# Patient Record
Sex: Male | Born: 1990 | Race: Black or African American | Hispanic: No | Marital: Married | Smoking: Never smoker
Health system: Southern US, Community
[De-identification: ages and names within clinical notes are randomized; demographics above are authoritative.]

---

## 2014-07-25 ENCOUNTER — Emergency Department (HOSPITAL_BASED_OUTPATIENT_CLINIC_OR_DEPARTMENT_OTHER)
Admission: EM | Admit: 2014-07-25 | Discharge: 2014-07-25 | Disposition: A | Discharge status: Home / Self Care | Payer: Commercial Managed Care - PPO | Attending: Emergency Medicine | Admitting: Emergency Medicine

## 2014-07-25 ENCOUNTER — Encounter (HOSPITAL_BASED_OUTPATIENT_CLINIC_OR_DEPARTMENT_OTHER): Payer: Self-pay | Admitting: Emergency Medicine

## 2014-07-25 ENCOUNTER — Emergency Department (HOSPITAL_BASED_OUTPATIENT_CLINIC_OR_DEPARTMENT_OTHER): Payer: Commercial Managed Care - PPO

## 2014-07-25 DIAGNOSIS — R0602 Shortness of breath: Secondary | ICD-10-CM | POA: Diagnosis not present

## 2014-07-25 DIAGNOSIS — R079 Chest pain, unspecified: Secondary | ICD-10-CM | POA: Diagnosis present

## 2014-07-25 DIAGNOSIS — I309 Acute pericarditis, unspecified: Secondary | ICD-10-CM | POA: Insufficient documentation

## 2014-07-25 DIAGNOSIS — E876 Hypokalemia: Secondary | ICD-10-CM | POA: Insufficient documentation

## 2014-07-25 LAB — TROPONIN I: Troponin I: <0.03 ng/mL (ref <0.031)

## 2014-07-25 LAB — BASIC METABOLIC PANEL
Anion gap: 10 (ref 5–15)
BUN: 18 mg/dL (ref 6–23)
CO2: 22 mmol/L (ref 19–32)
Calcium: 8.4 mg/dL (ref 8.4–10.5)
Chloride: 102 mmol/L (ref 96–112)
Creatinine, Ser: 0.98 mg/dL (ref 0.50–1.35)
GFR calc non Af Amer: >90 mL/min (ref >90)
GLUCOSE: 114 mg/dL — AB (ref 70–99)
POTASSIUM: 2.8 mmol/L — AB (ref 3.5–5.1)
Sodium: 134 mmol/L — ABNORMAL LOW (ref 135–145)

## 2014-07-25 LAB — CBC
HCT: 37.4 % — ABNORMAL LOW (ref 39.0–52.0)
Hemoglobin: 12.6 g/dL — ABNORMAL LOW (ref 13.0–17.0)
MCH: 29.4 pg (ref 26.0–34.0)
MCHC: 33.7 g/dL (ref 30.0–36.0)
MCV: 87.4 fL (ref 78.0–100.0)
PLATELETS: 237 10*3/uL (ref 150–400)
RBC: 4.28 MIL/uL (ref 4.22–5.81)
RDW: 13.4 % (ref 11.5–15.5)
WBC: 8.4 10*3/uL (ref 4.0–10.5)

## 2014-07-25 LAB — MAGNESIUM: MAGNESIUM: 1.9 mg/dL (ref 1.5–2.5)

## 2014-07-25 LAB — BRAIN NATRIURETIC PEPTIDE: B Natriuretic Peptide: 16.1 pg/mL (ref 0.0–100.0)

## 2014-07-25 MED ORDER — IBUPROFEN 600 MG PO TABS: 600.0000 mg | ORAL_TABLET | Freq: Four times a day (QID) | ORAL | Status: AC | PRN

## 2014-07-25 MED ORDER — POTASSIUM CHLORIDE CRYS ER 20 MEQ PO TBCR
40.0000 meq | EXTENDED_RELEASE_TABLET | Freq: Once | ORAL | Status: AC
  Administered 2014-07-25: 40 meq via ORAL
  Filled 2014-07-25: qty 2

## 2014-07-25 MED ORDER — ASPIRIN 81 MG PO CHEW
324.0000 mg | CHEWABLE_TABLET | Freq: Once | ORAL | Status: AC
  Administered 2014-07-25: 324 mg via ORAL
  Filled 2014-07-25: qty 4

## 2014-07-25 MED ORDER — PREDNISONE 50 MG PO TABS
60.0000 mg | ORAL_TABLET | Freq: Once | ORAL | Status: AC
  Administered 2014-07-25: 60 mg via ORAL
  Filled 2014-07-25 (×2): qty 1

## 2014-07-25 MED ORDER — PREDNISONE 50 MG PO TABS: 50.0000 mg | ORAL_TABLET | Freq: Every day | ORAL | Status: AC

## 2014-07-25 MED ORDER — MORPHINE SULFATE 4 MG/ML IJ SOLN
4.0000 mg | Freq: Once | INTRAMUSCULAR | Status: AC
  Administered 2014-07-25: 4 mg via INTRAVENOUS
  Filled 2014-07-25: qty 1

## 2014-07-25 NOTE — Discharge Instructions (Signed)
We think you have pericarditis (read below). The treatment is antiinflammatory meds. See the Cardiologist as requested. Return to the ER if the symptoms get worse, you pass out, there is increased shortness of breath or pain, or fevers.  ALSO, READ THE INFORMATION ON PULMONARY EMBOLISM. As discussed, we dont think you have a PE - but we cannot rule it out, so keep it's warning signs in the back of your mind as well.  Pericarditis Pericarditis is swelling (inflammation) of the pericardium. The pericardium is a thin, double-layered, fluid-filled tissue sac that surrounds the heart. The purpose of the pericardium is to contain the heart in the chest cavity and keep the heart from overexpanding. Different types of pericarditis can occur, such as:  Acute pericarditis. Inflammation can develop suddenly in acute pericarditis.  Chronic pericarditis. Inflammation develops gradually and is long-lasting in chronic pericarditis.  Constrictive pericarditis. In this type of pericarditis, the layers of the pericardium stiffen and develop scar tissue. The scar tissue thickens and sticks together. This makes it difficult for the heart to pump and work as it normally does. CAUSES  Pericarditis can be caused from different conditions, such as:  A bacterial, fungal or viral infection.  After a heart attack (myocardial infarction).  After open-heart surgery (coronary bypass graft surgery).  Auto-immune conditions such as lupus, rheumatoid arthritis or scleroderma.  Kidney failure.  Low thyroid condition (hypothyroidism).  Cancer from another part of the body that has spread (metastasized) to the pericardium.  Chest injury or trauma.  After radiation treatment.  Certain medicines. SYMPTOMS  Symptoms of pericarditis can include:  Chest pain. Chest pain symptoms may increase when laying down and may be relieved when sitting up and leaning forward.  A chronic, dry cough.  Heart palpitations. These  may feel like rapid, fluttering or pounding heart beats.  Chest pain may be worse when swallowing.  Dizziness or fainting.  Tiredness, fatigue or lethargy.  Fever. DIAGNOSIS  Pericarditis is diagnosed by the following:  A physical exam. A heart sound called a pericardial friction rub may be heard when your caregiver listens to your heart.  Blood work. Blood may be drawn to check for an infection and to look at your blood chemistry.  Electrocardiography. During electrocardiography your heart's electrical activity is monitored and recorded with a tracing on paper (electrocardiogram [ECG]).  Echocardiography.  Computed tomography (CT).  Magnetic resonance image (MRI). TREATMENT  To treat pericarditis, it is important to know the cause of it. The cause of pericarditis determines the treatment.   If the cause of pericarditis is due to an infection, treatment is based on the type of infection. If an infection is suspected in the pericardial fluid, a procedure called a pericardial fluid culture and biopsy may be done. This takes a sample of the pericardial fluid. The sample is sent to a lab which runs tests on the pericardial fluid to check for an infection.  If the autoimmune disease is the cause, treatment of the autoimmune condition will help improve the pericarditis.  If the cause of pericarditis is not known, anti-inflammatory medicines may be used to help decrease the inflammation.  Surgery may be needed. The following are types of surgeries or procedures that may be done to treat pericarditis:  Pericardial window. A pericardial window makes a cut (incision) into the pericardial sac. This allows excess fluid in the pericardium to drain.  Pericardiocentesis. A pericardiocentesis is also known as a pericardial tap. This procedure uses a needle that is guided by  X-ray to drain (aspirate) excess fluid from the pericardium.  Pericardiectomy. A pericardiectomy removes part or all of  the pericardium. HOME CARE INSTRUCTIONS   Do not smoke. If you smoke, quit. Your caregiver can help you quit smoking.  Maintain a healthy weight.  Follow an exercise program as told by your caregiver.  If you drink alcohol, do so in moderation.  Eat a heart healthy diet. A registered dietician can help you learn about healthy food choices.  Keep a list of all your medicines with you at all times. Include the name, dose, how often it is taken and how it is taken. SEEK IMMEDIATE MEDICAL CARE IF:   You have chest pain or feelings of chest pressure.  You have sweating (diaphoresis) when at rest.  You have irregular heartbeats (palpitations).  You have rapid, racing heart beats.  You have unexplained fainting episodes.  You feel sick to your stomach (nausea) or vomiting without cause.  You have unexplained weakness. If you develop any of the symptoms which originally made you seek care, call for local emergency medical help. Do not drive yourself to the hospital. Document Released: 10/06/2000 Document Revised: 07/05/2011 Document Reviewed: 04/14/2011 [facility] Patient Information 2015 [place], [place]. This information is not intended to replace advice given to you by your health care provider. Make sure you discuss any questions you have with your health care provider.  Pulmonary Embolism A pulmonary (lung) embolism (PE) is a blood clot that has traveled to the lung and results in a blockage of blood flow in the affected lung. Most clots come from deep veins in the legs or pelvis. PE is a dangerous and potentially life-threatening condition that can be treated if identified. CAUSES Blood clots form in a vein for different reasons. Usually several things cause blood clots. They include:  The flow of blood slows down.  The inside of the vein is damaged in some way.  The person has a condition that makes the blood clot more easily. RISK FACTORS Some people are more likely than  others to develop PE. Risk factors include:   Smoking.  Being overweight (obese).  Sitting or lying still for a long time. This includes long-distance travel, paralysis, or recovery from an illness or surgery. Other factors that increase risk are:   Older age, especially over 20 years of age.  Having a family history of blood clots or if you have already had a blood clot.  Having major or lengthy surgery. This is especially true for surgery on the hip, knee, or belly (abdomen). Hip surgery is particularly high risk.  Having a long, thin tube (catheter) placed inside a vein during a medical procedure.  Breaking a hip or leg.  Having cancer or cancer treatment.  Medicines containing the male hormone estrogen. This includes birth control pills and hormone replacement therapy.  Other circulation or heart problems.  Pregnancy and childbirth.  Hormone changes make the blood clot more easily during pregnancy.  The fetus puts pressure on the veins of the pelvis.  There is a risk of injury to veins during delivery or a caesarean delivery. The risk is highest just after childbirth.  PREVENTION   Exercise the legs regularly. Take a brisk 30 minute walk every day.  Maintain a weight that is appropriate for your height.  Avoid sitting or lying in bed for long periods of time without moving your legs.  Women, particularly those over the age of 35 years, should consider the risks and benefits of  taking estrogen medicines, including birth control pills.  Do not smoke, especially if you take estrogen medicines.  Long-distance travel can increase your risk. You should exercise your legs by walking or pumping the muscles every hour.  Many of the risk factors above relate to situations that exist with hospitalization, either for illness, injury, or elective surgery. Prevention may include medical and nonmedical measures.   Your health care provider will assess you for the need for  venous thromboembolism prevention when you are admitted to the hospital. If you are having surgery, your surgeon will assess you the day of or day after surgery.  SYMPTOMS  The symptoms of a PE usually start suddenly and include:  Shortness of breath.  Coughing.  Coughing up blood or blood-tinged mucus.  Chest pain. Pain is often worse with deep breaths.  Rapid heartbeat. DIAGNOSIS  If a PE is suspected, your health care provider will take a medical history and perform a physical exam. Other tests that may be required include:  Blood tests, such as studies of the clotting properties of your blood.  Imaging tests, such as ultrasound, CT, MRI, and other tests to see if you have clots in your legs or lungs.  An electrocardiogram. This can look for heart strain from blood clots in the lungs. TREATMENT   The most common treatment for a PE is blood thinning (anticoagulant) medicine, which reduces the blood's tendency to clot. Anticoagulants can stop new blood clots from forming and old clots from growing. They cannot dissolve existing clots. Your body does this by itself over time. Anticoagulants can be given by mouth, through an intravenous (IV) tube, or by injection. Your health care provider will determine the best program for you.  Less commonly, clot-dissolving medicines (thrombolytics) are used to dissolve a PE. They carry a high risk of bleeding, so they are used mainly in severe cases.  Very rarely, a blood clot in the leg needs to be removed surgically.  If you are unable to take anticoagulants, your health care provider may arrange for you to have a filter placed in a main vein in your abdomen. This filter prevents clots from traveling to your lungs. HOME CARE INSTRUCTIONS   Take all medicines as directed by your health care provider.  Learn as much as you can about DVT.  Wear a medical alert bracelet or carry a medical alert card.  Ask your health care provider how soon  you can go back to normal activities. It is important to stay active to prevent blood clots. If you are on anticoagulant medicine, avoid contact sports.  It is very important to exercise. This is especially important while traveling, sitting, or standing for long periods of time. Exercise your legs by walking or by tightening and relaxing your leg muscles regularly. Take frequent walks.  You may need to wear compression stockings. These are tight elastic stockings that apply pressure to the lower legs. This pressure can help keep the blood in the legs from clotting. Taking Warfarin Warfarin is a daily medicine that is taken by mouth. Your health care provider will advise you on the length of treatment (usually 3-6 months, sometimes lifelong). If you take warfarin:  Understand how to take warfarin and foods that can affect how warfarin works in Public relations account executiveyour body.  Too much and too little warfarin are both dangerous. Too much warfarin increases the risk of bleeding. Too little warfarin continues to allow the risk for blood clots. Warfarin and Regular Blood Testing While  taking warfarin, you will need to have regular blood tests to measure your blood clotting time. These blood tests usually include both the prothrombin time (PT) and international normalized ratio (INR) tests. The PT and INR results allow your health care provider to adjust your dose of warfarin. It is very important that you have your PT and INR tested as often as directed by your health care provider.  Warfarin and Your Diet Avoid major changes in your diet, or notify your health care provider before changing your diet. Arrange a visit with a registered dietitian to answer your questions. Many foods, especially foods high in vitamin K, can interfere with warfarin and affect the PT and INR results. You should eat a consistent amount of foods high in vitamin K. Foods high in vitamin K include:   Spinach, kale, broccoli, cabbage, collard and  turnip greens, Brussels sprouts, peas, cauliflower, seaweed, and parsley.  Beef and pork liver.  Green tea.  Soybean oil. Warfarin with Other Medicines Many medicines can interfere with warfarin and affect the PT and INR results. You must:  Tell your health care provider about any and all medicines, vitamins, and supplements you take, including aspirin and other over-the-counter anti-inflammatory medicines. Be especially cautious with aspirin and anti-inflammatory medicines. Ask your health care provider before taking these.  Do not take or discontinue any prescribed or over-the-counter medicine except on the advice of your health care provider or pharmacist. Warfarin Side Effects Warfarin can have side effects, such as easy bruising and difficulty stopping bleeding. Ask your health care provider or pharmacist about other side effects of warfarin. You will need to:  Hold pressure over cuts for longer than usual.  Notify your dentist and other health care providers that you are taking warfarin before you undergo any procedures where bleeding may occur. Warfarin with Alcohol and Tobacco   Drinking alcohol frequently can increase the effect of warfarin, leading to excess bleeding. It is best to avoid alcoholic drinks or consume only very small amounts while taking warfarin. Notify your health care provider if you change your alcohol intake.  Do not use any tobacco products including cigarettes, chewing tobacco, or electronic cigarettes. If you smoke, quit. Ask your health care provider for help with quitting smoking. Alternative Medicines to Warfarin: Factor Xa Inhibitor Medicines  These blood thinning medicines are taken by mouth, usually for several weeks or longer. It is important to take the medicine every single day, at the same time each day.  There are no regular blood tests required when using these medicines.  There are fewer food and drug interactions than with warfarin.  The  side effects of this class of medicine is similar to that of warfarin, including excessive bruising or bleeding. Ask your health care provider or pharmacist about other potential side effects. SEEK MEDICAL CARE IF:   You notice a rapid heartbeat.  You feel weaker or more tired than usual.  You feel faint.  You notice increased bruising.  Your symptoms are not getting better in the time expected.  You are having side effects of medicine. SEEK IMMEDIATE MEDICAL CARE IF:   You have chest pain.  You have trouble breathing.  You have new or increased swelling or pain in one leg.  You cough up blood.  You notice blood in vomit, in a bowel movement, or in urine.  You have a fever. Symptoms of PE may represent a serious problem that is an emergency. Do not wait to see if the  symptoms will go away. Get medical help right away. Call your local emergency services (911 in the [place]). Do not drive yourself to the hospital. Document Released: 04/09/2000 Document Revised: 08/27/2013 Document Reviewed: 04/23/2013 [facility] Patient Information 2015 [place], [place]. This information is not intended to replace advice given to you by your health care provider. Make sure you discuss any questions you have with your health care provider.

## 2014-07-25 NOTE — ED Notes (Signed)
Patient states that he was seen and treated by his PMD for stomach virus earlier today. The patient started to have chest pain and sob starting at about 830 last night. The patient is no distress, the patient states that his dysnea is worse while lying down.

## 2014-07-25 NOTE — ED Notes (Signed)
C/o cp onset last pm, w sob,  Non radiating,  Is being treated for gi trouble

## 2014-07-25 NOTE — ED Provider Notes (Signed)
CSN: [id number]     Arrival date & time 07/25/14  0242 History   First MD Initiated Contact with Patient 07/25/14 0254     Chief Complaint  Patient presents with  . Chest Pain  . Abdominal Pain     (Consider location/radiation/quality/duration/timing/severity/associated sxs/prior Treatment) HPI Comments: Pt comes in with cc of chest pain. Pt is fighting a stomach virus. He started having chest pain and dib last night, sudden onset. Pain is midsternal, and non radiating. Pain is sharp, and constant. Pain is worse with inspiration and with laying supine. Pt also has some associated shortness of breath, when laying flat. There is no nausea, sweats. No exertional component to the pain. Pt has no hx of PE, DVT and denies any exogenous estrogen use, long distance travels or surgery in the past 6 weeks, active cancer, recent immobilization.   ROS 10 Systems reviewed and are negative for acute change except as noted in the HPI.     The history is provided by the patient.    History reviewed. No pertinent past medical history. History reviewed. No pertinent past surgical history. No family history on file. History  Substance Use Topics  . Smoking status: Never Smoker   . Smokeless tobacco: Not on file  . Alcohol Use: No    Review of Systems  Respiratory: Positive for cough, chest tightness and shortness of breath.   Cardiovascular: Positive for chest pain.  All other systems reviewed and are negative.     Allergies  Sulfa antibiotics  Home Medications   Prior to Admission medications   Medication Sig Start Date End Date Taking? Authorizing Provider  ibuprofen (ADVIL,MOTRIN) 600 MG tablet Take 1 tablet (600 mg total) by mouth every 6 (six) hours as needed. 07/25/14   Derwood Kaplan, MD  predniSONE (DELTASONE) 50 MG tablet Take 1 tablet (50 mg total) by mouth daily. 07/25/14   Jader Desai Rhunette Croft, MD   BP 135/94 mmHg  Pulse 91  Temp(Src) 99.4 F (37.4 C) (Oral)  Resp 18  Ht 5'  5" (1.651 m)  Wt 202 lb (91.627 kg)  BMI 33.61 kg/m2  SpO2 100% Physical Exam  Constitutional: He is oriented to person, place, and time. He appears well-developed.  HENT:  Head: Normocephalic and atraumatic.  Eyes: Conjunctivae and EOM are normal. Pupils are equal, round, and reactive to light.  Neck: Normal range of motion. Neck supple. No JVD present.  Cardiovascular: Normal rate and regular rhythm.   Pulmonary/Chest: Effort normal and breath sounds normal. He has no wheezes. He has no rales.  Abdominal: Soft. Bowel sounds are normal. He exhibits no distension. There is no tenderness. There is no rebound and no guarding.  Musculoskeletal: He exhibits no edema or tenderness.  Neurological: He is alert and oriented to person, place, and time.  Skin: Skin is warm.  Nursing note and vitals reviewed.   ED Course  Procedures (including critical care time) Labs Review Labs Reviewed  CBC - Abnormal; Notable for the following:    Hemoglobin 12.6 (*)    HCT 37.4 (*)    All other components within normal limits  BASIC METABOLIC PANEL - Abnormal; Notable for the following:    Sodium 134 (*)    Potassium 2.8 (*)    Glucose, Bld 114 (*)    All other components within normal limits  TROPONIN I  BRAIN NATRIURETIC PEPTIDE  MAGNESIUM  TROPONIN I    Imaging Review Dg Chest 2 View  07/25/2014   CLINICAL DATA:  Acute onset of upper chest pain, shortness of breath and difficulty breathing. Initial encounter.  EXAM: CHEST  2 VIEW  COMPARISON:  None.  FINDINGS: The lungs are well-aerated and clear. There is no evidence of focal opacification, pleural effusion or pneumothorax.  The heart is normal in size; the mediastinal contour is within normal limits. No acute osseous abnormalities are seen.  IMPRESSION: No acute cardiopulmonary process seen.   Electronically Signed   By: Roanna RaiderJeffery  Chang M.D.   On: 07/25/2014 03:25     EKG Interpretation  Date/Time:  Thursday July 25 2014 03:10:42  EDT Ventricular Rate:  95 PR Interval:  164 QRS Duration: 86 QT Interval:  346 QTC Calculation: 425 R Axis:   87 Text Interpretation:  Normal sinus rhythm Nonspecific T wave abnormality  Abnormal ECG unchanged ekg from previous Confirmed by Rhunette CroftNANAVATI, MD, Orva Gwaltney  [phone]([id number]) on 07/25/2014 3:51:17 AM      EKG Interpretation   Date/Time:  Thursday July 25 2014 03:10:42 EDT Ventricular Rate:  91 PR Interval:  164 QRS Duration: 86 QT Interval:  346 QTC Calculation: 425 R Axis:   87 Text Interpretation:  Normal sinus rhythm Nonspecific T wave abnormality  Abnormal ECG unchanged ekg from previous Confirmed by Janeya Deyo, MD, Charleen Madera  [phone]([id number]) on 07/25/2014 3:51:17 AM          MDM   Final diagnoses:  Acute pericarditis  Hypokalemia    PT comes in with cc of chest pain. Chest pain is pleuritic, positional. The EKG has a s1q3t3 pattern, and patient has some dib - especially when supine. Pt also suffering from a viral gastro. Pt is not a smoker, and has no PE risk factors.  DDX: Pericarditis, PE, ACS, Myocarditis, Pneumonia  Clinically, he appears to have pericarditis. There is s1q3t3 - but the symptoms are sudden onset, and the pleurisy and positional nature of the pain are consistent with Pericarditis.  Plan is to gets trops x 2.  Pericarditis vs. PE concerns discussed with pt and mother - and i shared how i think former is more likely. I shared with them PE findings, and strict return precautions. Cards follow up has been provided. I think the s1q3t3 pattern is just a non specific finding - and he might need an echo which cards can provide.    Derwood KaplanAnkit Lorren Rossetti, MD 07/25/14 [phone]

## 2016-04-27 IMAGING — CR DG CHEST 2V
2 series · 2 of 2 positions shown · non-contrast
Comparison: None.

CLINICAL DATA: Acute onset of upper chest pain, shortness of breath
and difficulty breathing. Initial encounter.

EXAM:
CHEST  2 VIEW

[w chest pa]
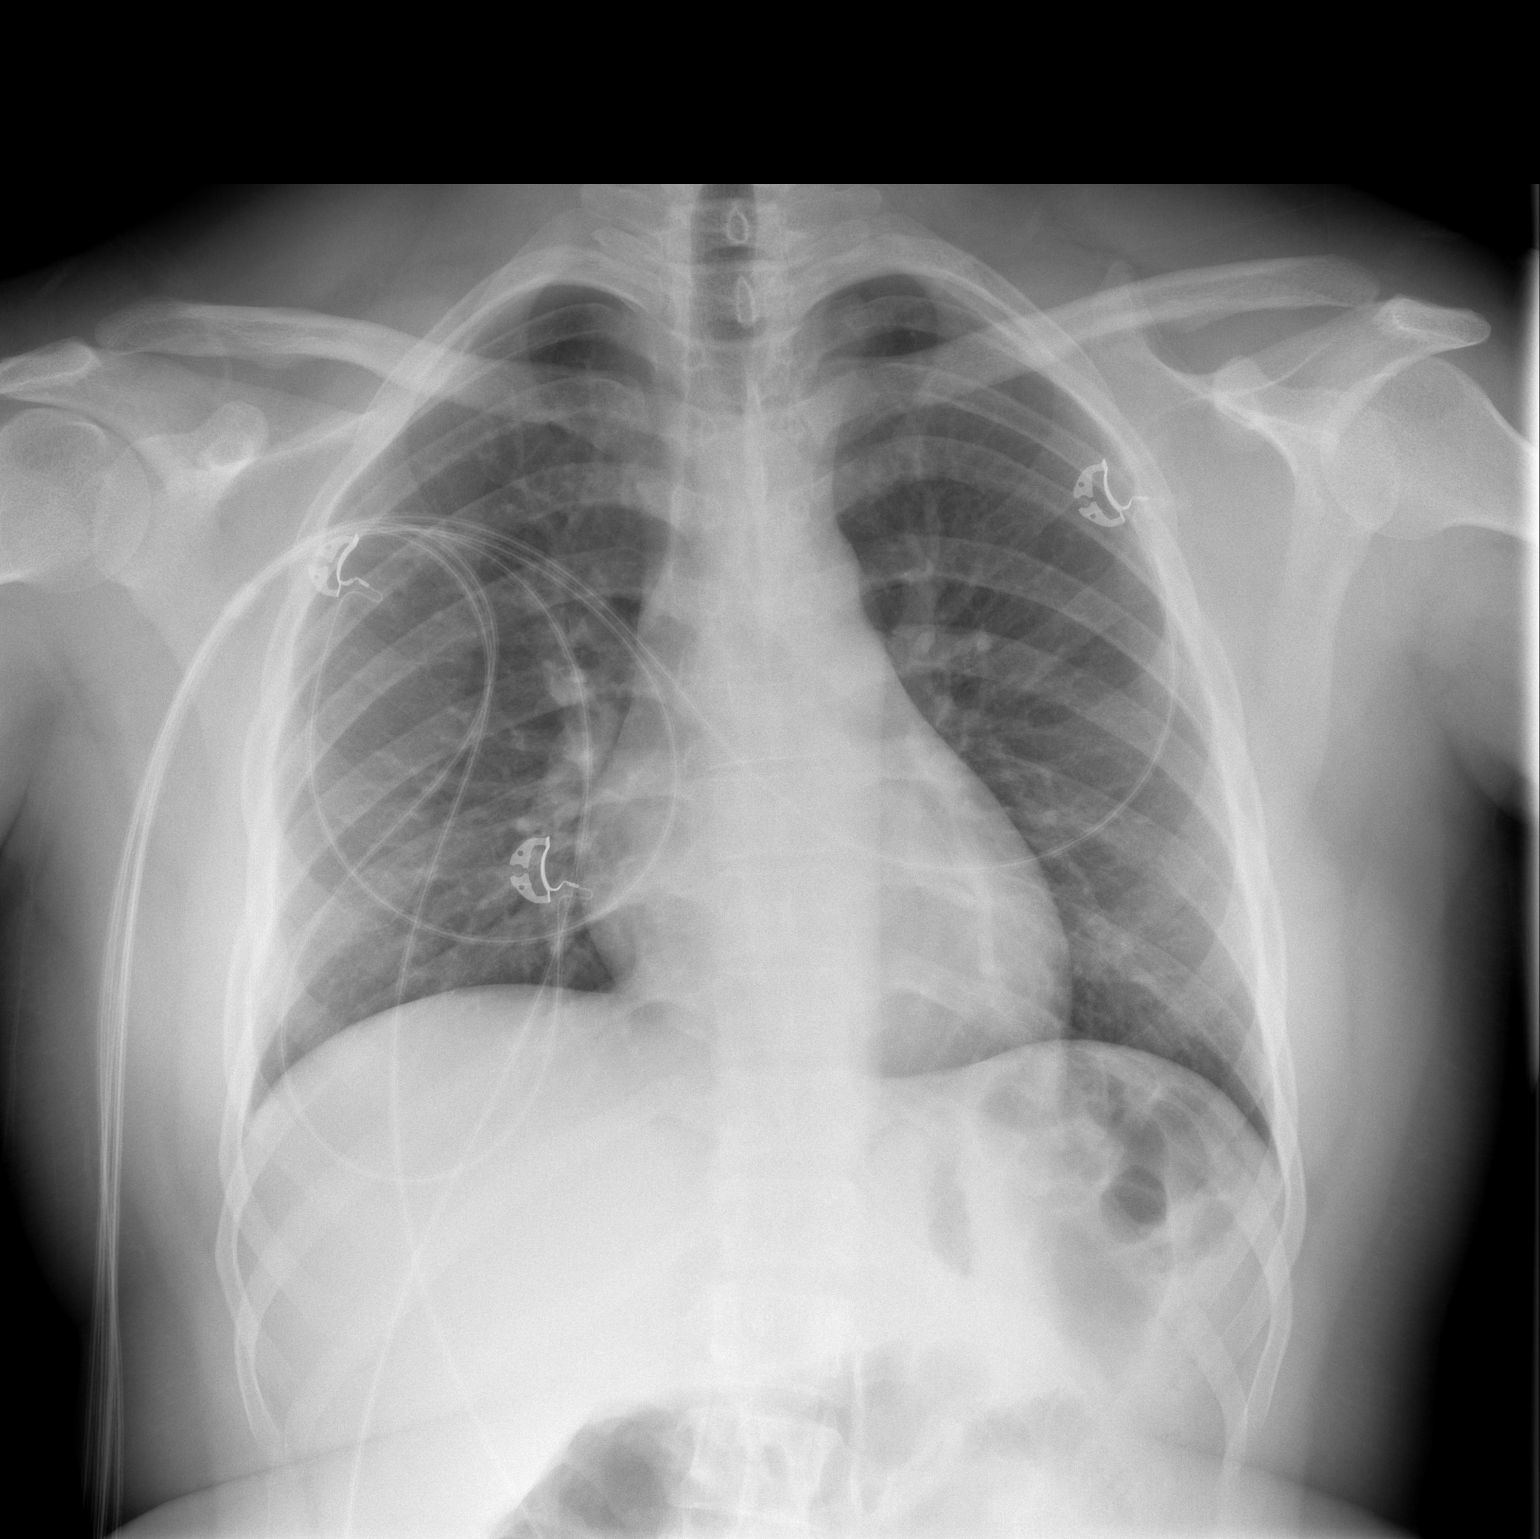

[w chest lat]
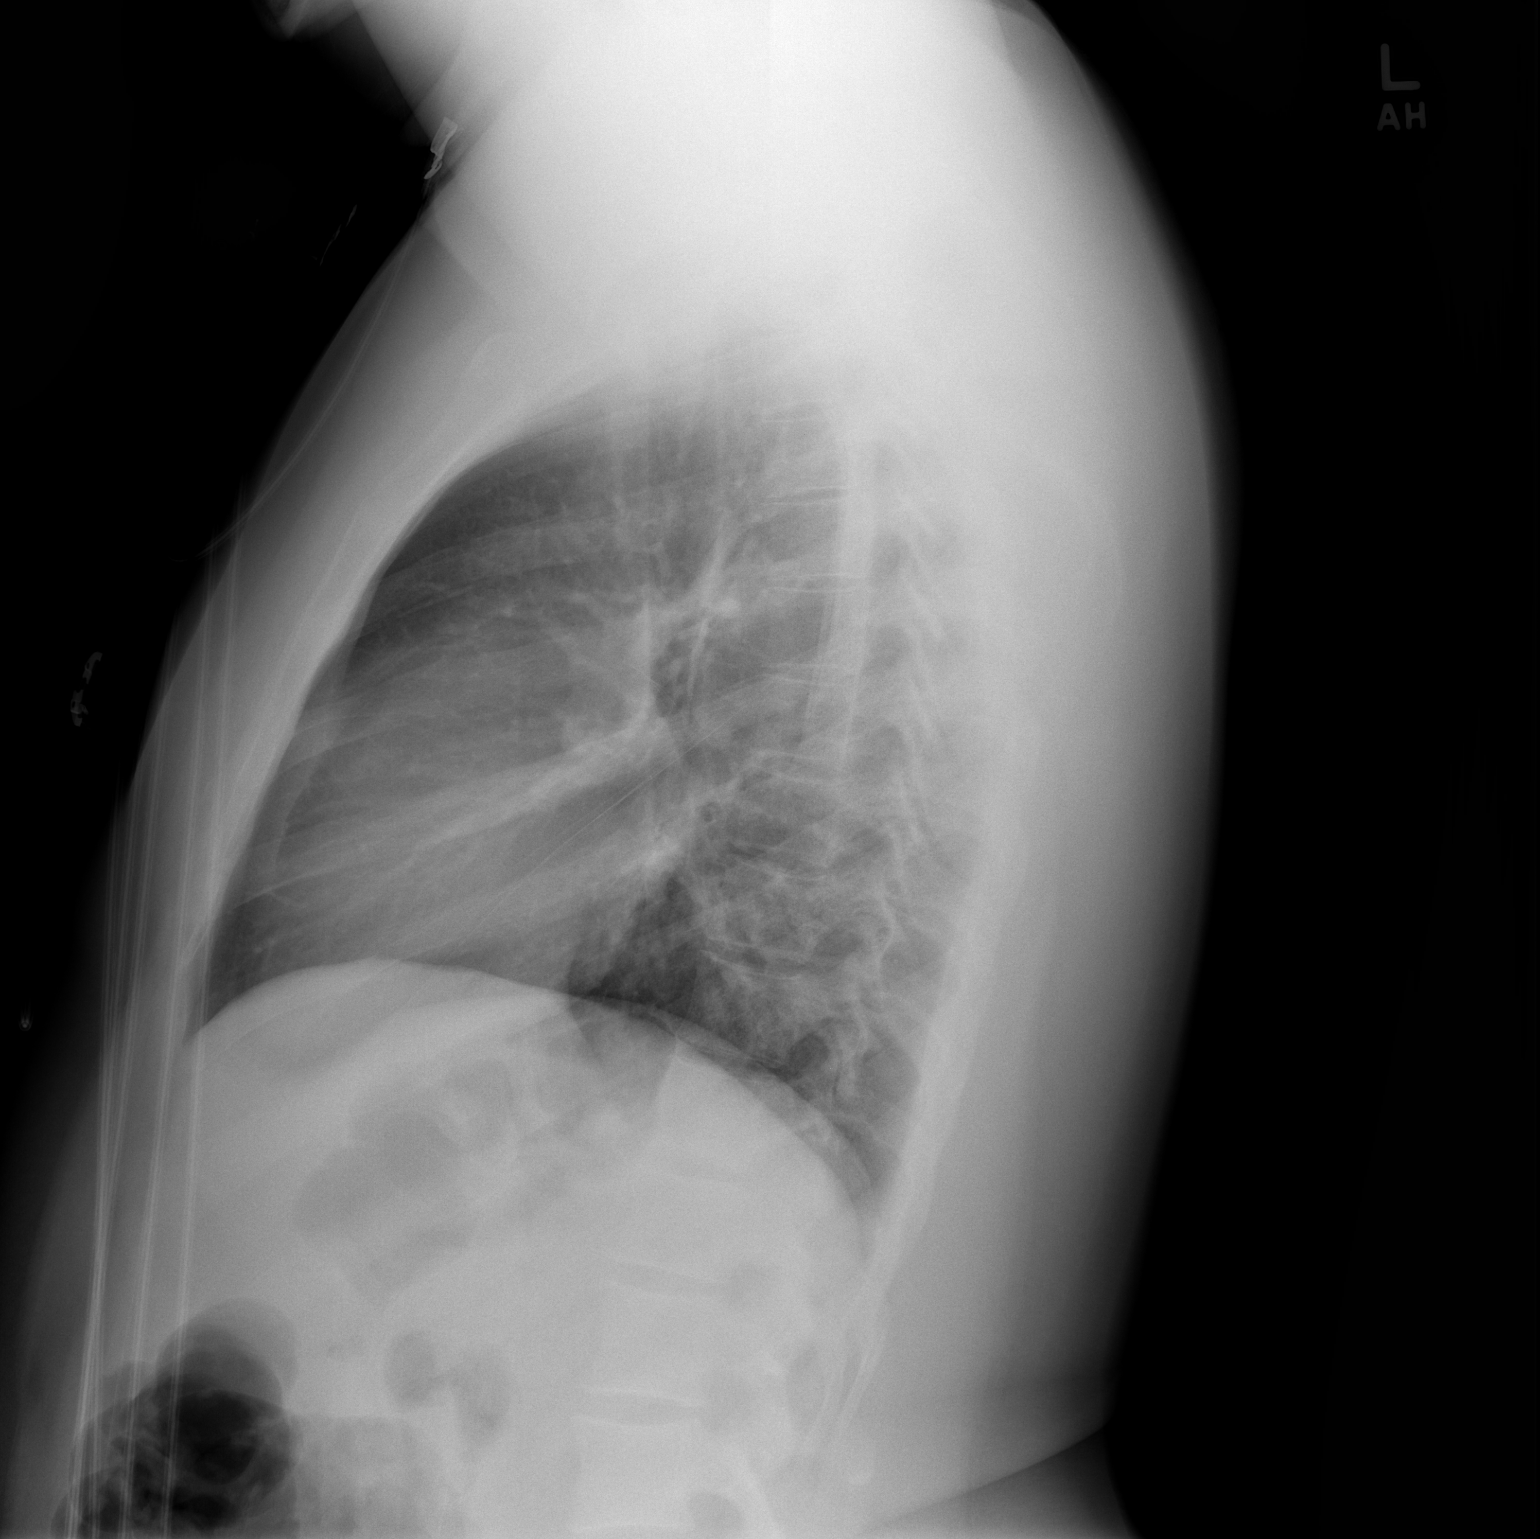

[2 of 2 positions shown; findings below may reference images not displayed]

FINDINGS: The lungs are well-aerated and clear. There is no evidence of focal
opacification, pleural effusion or pneumothorax.

The heart is normal in size; the mediastinal contour is within
normal limits. No acute osseous abnormalities are seen.
IMPRESSION: No acute cardiopulmonary process seen.

## 2018-12-06 ENCOUNTER — Other Ambulatory Visit: Payer: Self-pay

## 2018-12-06 DIAGNOSIS — Z20822 Contact with and (suspected) exposure to covid-19: Secondary | ICD-10-CM

## 2018-12-07 LAB — NOVEL CORONAVIRUS, NAA: SARS-CoV-2, NAA: NOT DETECTED

## 2023-05-16 ENCOUNTER — Emergency Department (HOSPITAL_BASED_OUTPATIENT_CLINIC_OR_DEPARTMENT_OTHER)
Admission: EM | Admit: 2023-05-16 | Discharge: 2023-05-16 | Disposition: A | Discharge status: Home / Self Care | Payer: BC Managed Care – PPO

## 2023-05-16 ENCOUNTER — Emergency Department (HOSPITAL_BASED_OUTPATIENT_CLINIC_OR_DEPARTMENT_OTHER): Payer: BC Managed Care – PPO

## 2023-05-16 ENCOUNTER — Other Ambulatory Visit: Payer: Self-pay

## 2023-05-16 ENCOUNTER — Encounter (HOSPITAL_BASED_OUTPATIENT_CLINIC_OR_DEPARTMENT_OTHER): Payer: Self-pay | Admitting: Radiology

## 2023-05-16 DIAGNOSIS — R0602 Shortness of breath: Secondary | ICD-10-CM | POA: Insufficient documentation

## 2023-05-16 DIAGNOSIS — R079 Chest pain, unspecified: Secondary | ICD-10-CM | POA: Diagnosis present

## 2023-05-16 DIAGNOSIS — Z20822 Contact with and (suspected) exposure to covid-19: Secondary | ICD-10-CM | POA: Insufficient documentation

## 2023-05-16 DIAGNOSIS — R0789 Other chest pain: Secondary | ICD-10-CM

## 2023-05-16 LAB — TROPONIN I (HIGH SENSITIVITY)
Troponin I (High Sensitivity): 6 ng/L (ref <18)
Troponin I (High Sensitivity): 6 ng/L (ref <18)

## 2023-05-16 LAB — BASIC METABOLIC PANEL
Anion gap: 10 (ref 5–15)
BUN: 16 mg/dL (ref 6–20)
CO2: 19 mmol/L — ABNORMAL LOW (ref 22–32)
Calcium: 9 mg/dL (ref 8.9–10.3)
Chloride: 104 mmol/L (ref 98–111)
Creatinine, Ser: 0.75 mg/dL (ref 0.61–1.24)
GFR, Estimated: >60 mL/min (ref >60)
Glucose, Bld: 88 mg/dL (ref 70–99)
Potassium: 3.6 mmol/L (ref 3.5–5.1)
Sodium: 133 mmol/L — ABNORMAL LOW (ref 135–145)

## 2023-05-16 LAB — RESP PANEL BY RT-PCR (RSV, FLU A&B, COVID)  RVPGX2
Influenza A by PCR: NEGATIVE
Influenza B by PCR: NEGATIVE
Resp Syncytial Virus by PCR: NEGATIVE
SARS Coronavirus 2 by RT PCR: NEGATIVE

## 2023-05-16 LAB — CBC
HCT: 37.1 % — ABNORMAL LOW (ref 39.0–52.0)
Hemoglobin: 12.4 g/dL — ABNORMAL LOW (ref 13.0–17.0)
MCH: 28.7 pg (ref 26.0–34.0)
MCHC: 33.4 g/dL (ref 30.0–36.0)
MCV: 85.9 fL (ref 80.0–100.0)
Platelets: 309 10*3/uL (ref 150–400)
RBC: 4.32 MIL/uL (ref 4.22–5.81)
RDW: 14.2 % (ref 11.5–15.5)
WBC: 7.2 10*3/uL (ref 4.0–10.5)
nRBC: 0 % (ref 0.0–0.2)

## 2023-05-16 LAB — D-DIMER, QUANTITATIVE: D-Dimer, Quant: <0.27 ug{FEU}/mL (ref 0.00–0.50)

## 2023-05-16 NOTE — Discharge Instructions (Signed)
Please follow-up with your primary doctor.  Return immediately to the nearest emergency department if you develop fevers, chills, worsening chest pain, shortness of breath, abdominal pain, inability to eat or drink due to nausea vomiting or he develop any new worsening symptoms that are concerning to you.

## 2023-05-16 NOTE — ED Provider Notes (Signed)
[place] [facility] AT [facility] Provider Note   CSN: [id number] Arrival date & time: 05/16/23  1630     History  Chief Complaint  Patient presents with   Chest Pain    Brandon Carlson is a 33 y.o. male.  This is a 33 year old male presenting emergency department for chest pain.  Been intermittent over the past week.  Center of chest.  Some associated shortness of breath.  No palpitations.  No radiation.  No specific time or inciting factors that he can recall.  No change with physical exertion.  Had an episode earlier today and presented for evaluation.  Not currently having chest pain on my evaluation.   Chest Pain      Home Medications Prior to Admission medications   Medication Sig Start Date End Date Taking? Authorizing Provider  ibuprofen (ADVIL,MOTRIN) 600 MG tablet Take 1 tablet (600 mg total) by mouth every 6 (six) hours as needed. 07/25/14   Derwood Kaplan, MD  predniSONE (DELTASONE) 50 MG tablet Take 1 tablet (50 mg total) by mouth daily. 07/25/14   Derwood Kaplan, MD      Allergies    Sulfa antibiotics    Review of Systems   Review of Systems  Cardiovascular:  Positive for chest pain.    Physical Exam Updated Vital Signs BP (!) 149/99   Pulse 89   Temp 98 F (36.7 C)   Resp 17   Ht 5\' 5"  (1.651 m)   Wt 120 kg   SpO2 100%   BMI 44.02 kg/m  Physical Exam Vitals and nursing note reviewed.  Constitutional:      General: He is not in acute distress.    Appearance: He is obese. He is not toxic-appearing.  Cardiovascular:     Rate and Rhythm: Normal rate.     Heart sounds: Normal heart sounds.  Pulmonary:     Effort: Pulmonary effort is normal.  Musculoskeletal:     Right lower leg: No edema.     Left lower leg: No edema.  Skin:    General: Skin is warm and dry.     Capillary Refill: Capillary refill takes less than 2 seconds.  Neurological:     General: No focal deficit present.     Mental Status: He is alert.   Psychiatric:        Mood and Affect: Mood normal.        Behavior: Behavior normal.     ED Results / Procedures / Treatments   Labs (all labs ordered are listed, but only abnormal results are displayed) Labs Reviewed  BASIC METABOLIC PANEL - Abnormal; Notable for the following components:      Result Value   Sodium 133 (*)    CO2 19 (*)    All other components within normal limits  CBC - Abnormal; Notable for the following components:   Hemoglobin 12.4 (*)    HCT 37.1 (*)    All other components within normal limits  RESP PANEL BY RT-PCR (RSV, FLU A&B, COVID)  RVPGX2  D-DIMER, QUANTITATIVE  TROPONIN I (HIGH SENSITIVITY)  TROPONIN I (HIGH SENSITIVITY)    EKG None  Radiology DG Chest 2 View Result Date: 05/16/2023 CLINICAL DATA:  Chest pain EXAM: CHEST - 2 VIEW COMPARISON:  07/25/2014 FINDINGS: The heart size and mediastinal contours are within normal limits. Both lungs are clear. The visualized skeletal structures are unremarkable. IMPRESSION: No active cardiopulmonary disease. Electronically Signed   By: Adrian Prows.D.  On: 05/16/2023 17:30    Procedures Procedures    Medications Ordered in ED Medications - No data to display  ED Course/ Medical Decision Making/ A&P                                 Medical Decision Making This is a well-appearing 33 year old male presenting emergency department for chest pain.  Afebrile nontachycardic, some hypertension noted.  Maintaining ox saturation on room air.  On exam equal pulses does not appear to be in distress.  Clear lungs.  EKG without ST segment changes to indicate ischemia.  Troponin negative.  Low heart score.  Chest x-ray without pneumonia pneumothorax on my independent interpretation.  He has no leukocytosis to suggest systemic infection.  Mild hyponatremia, normal kidney function.  Triage ordered flu/COVID/RSV it was negative.  Given patient's symptoms with chest pain and associated shortness of breath, concern  for possible PE.  However low risk.  D-dimer is negative which makes probability quite low.  Spouse in room during discussion of test results and noted that symptoms seemingly started after he ate pizza.  Symptoms possibly related to GERD.  Discussed supportive care.  Has primary doctor to follow-up with.  Stable for discharge at this time.  Amount and/or Complexity of Data Reviewed External Data Reviewed:     Details: Does not appear that he is interacting with her healthcare system.  Reports history of pericarditis.  However no workup or records on file. Labs: ordered. Radiology: ordered.  Risk Decision regarding hospitalization.          Final Clinical Impression(s) / ED Diagnoses Final diagnoses:  None    Rx / DC Orders ED Discharge Orders     None         Coral Spikes, DO 05/16/23 1957

## 2023-05-16 NOTE — ED Triage Notes (Signed)
Pt states he has chest pain for the past week that comes and goes. Pt states pain is center of chest and does not radiate to any other location. Pt states he also has difficulty breathing when he is having the chest pain. Denies exposure to illness and denies fever.
